# Patient Record
Sex: Male | Born: 1935 | Race: White | Hispanic: No | Marital: Married | Smoking: Never smoker
Health system: Southern US, Community
[De-identification: ages and names within clinical notes are randomized; demographics above are authoritative.]

## PROBLEM LIST (undated history)

## (undated) DIAGNOSIS — E785 Hyperlipidemia, unspecified: Secondary | ICD-10-CM

## (undated) DIAGNOSIS — K219 Gastro-esophageal reflux disease without esophagitis: Secondary | ICD-10-CM

## (undated) DIAGNOSIS — J45909 Unspecified asthma, uncomplicated: Secondary | ICD-10-CM

## (undated) DIAGNOSIS — I10 Essential (primary) hypertension: Secondary | ICD-10-CM

## (undated) DIAGNOSIS — J449 Chronic obstructive pulmonary disease, unspecified: Secondary | ICD-10-CM

## (undated) HISTORY — DX: Unspecified asthma, uncomplicated: J45.909

## (undated) HISTORY — DX: Essential (primary) hypertension: I10

## (undated) HISTORY — DX: Chronic obstructive pulmonary disease, unspecified: J44.9

## (undated) HISTORY — DX: Hyperlipidemia, unspecified: E78.5

## (undated) HISTORY — DX: Gastro-esophageal reflux disease without esophagitis: K21.9

---

## 2001-10-25 ENCOUNTER — Emergency Department (HOSPITAL_COMMUNITY): Admission: EM | Admit: 2001-10-25 | Discharge: 2001-10-26 | Payer: Self-pay | Admitting: Emergency Medicine

## 2012-02-19 ENCOUNTER — Encounter (HOSPITAL_BASED_OUTPATIENT_CLINIC_OR_DEPARTMENT_OTHER): Payer: Self-pay

## 2012-02-19 ENCOUNTER — Ambulatory Visit (HOSPITAL_BASED_OUTPATIENT_CLINIC_OR_DEPARTMENT_OTHER)
Admission: RE | Admit: 2012-02-19 | Discharge: 2012-02-19 | Disposition: A | Discharge status: Home / Self Care | Payer: Medicare Other | Source: Ambulatory Visit | Attending: Family Medicine | Admitting: Family Medicine

## 2012-02-19 ENCOUNTER — Other Ambulatory Visit (HOSPITAL_BASED_OUTPATIENT_CLINIC_OR_DEPARTMENT_OTHER): Payer: Self-pay | Admitting: Family Medicine

## 2012-02-19 DIAGNOSIS — R7989 Other specified abnormal findings of blood chemistry: Secondary | ICD-10-CM

## 2012-02-19 DIAGNOSIS — R799 Abnormal finding of blood chemistry, unspecified: Secondary | ICD-10-CM | POA: Insufficient documentation

## 2012-02-19 DIAGNOSIS — R0602 Shortness of breath: Secondary | ICD-10-CM | POA: Insufficient documentation

## 2012-02-19 MED ORDER — IOHEXOL 350 MG/ML SOLN
80.0000 mL | Freq: Once | INTRAVENOUS | Status: AC | PRN
  Administered 2012-02-19: 80 mL via INTRAVENOUS

## 2013-03-24 ENCOUNTER — Ambulatory Visit: Payer: Medicare Other | Attending: Physical Medicine and Rehabilitation | Admitting: Physical Therapy

## 2013-03-24 DIAGNOSIS — Z9181 History of falling: Secondary | ICD-10-CM | POA: Insufficient documentation

## 2013-03-24 DIAGNOSIS — R262 Difficulty in walking, not elsewhere classified: Secondary | ICD-10-CM | POA: Insufficient documentation

## 2013-03-24 DIAGNOSIS — IMO0001 Reserved for inherently not codable concepts without codable children: Secondary | ICD-10-CM | POA: Insufficient documentation

## 2013-03-24 DIAGNOSIS — M6281 Muscle weakness (generalized): Secondary | ICD-10-CM | POA: Insufficient documentation

## 2013-03-27 ENCOUNTER — Ambulatory Visit: Payer: Medicare Other | Admitting: Physical Therapy

## 2013-03-31 ENCOUNTER — Ambulatory Visit: Payer: Medicare Other | Admitting: Physical Therapy

## 2013-04-03 ENCOUNTER — Ambulatory Visit: Payer: Medicare Other | Admitting: Physical Therapy

## 2013-04-15 ENCOUNTER — Ambulatory Visit: Payer: Medicare Other | Attending: Physical Medicine and Rehabilitation | Admitting: Physical Therapy

## 2013-04-15 DIAGNOSIS — Z9181 History of falling: Secondary | ICD-10-CM | POA: Insufficient documentation

## 2013-04-15 DIAGNOSIS — R262 Difficulty in walking, not elsewhere classified: Secondary | ICD-10-CM | POA: Insufficient documentation

## 2013-04-15 DIAGNOSIS — M6281 Muscle weakness (generalized): Secondary | ICD-10-CM | POA: Insufficient documentation

## 2013-04-15 DIAGNOSIS — IMO0001 Reserved for inherently not codable concepts without codable children: Secondary | ICD-10-CM | POA: Insufficient documentation

## 2013-04-21 ENCOUNTER — Ambulatory Visit: Payer: Medicare Other | Admitting: Physical Therapy

## 2013-04-28 ENCOUNTER — Ambulatory Visit: Payer: Medicare Other | Attending: Physical Medicine and Rehabilitation | Admitting: Physical Therapy

## 2013-04-28 DIAGNOSIS — M6281 Muscle weakness (generalized): Secondary | ICD-10-CM | POA: Insufficient documentation

## 2013-04-28 DIAGNOSIS — R262 Difficulty in walking, not elsewhere classified: Secondary | ICD-10-CM | POA: Insufficient documentation

## 2013-04-28 DIAGNOSIS — IMO0001 Reserved for inherently not codable concepts without codable children: Secondary | ICD-10-CM | POA: Insufficient documentation

## 2013-04-28 DIAGNOSIS — Z9181 History of falling: Secondary | ICD-10-CM | POA: Insufficient documentation

## 2013-04-30 ENCOUNTER — Ambulatory Visit: Payer: Medicare Other | Admitting: Physical Therapy

## 2013-05-04 ENCOUNTER — Ambulatory Visit: Payer: Medicare Other | Admitting: Physical Therapy

## 2013-05-07 ENCOUNTER — Ambulatory Visit: Payer: Medicare Other | Admitting: Physical Therapy

## 2013-05-12 ENCOUNTER — Ambulatory Visit: Payer: Medicare Other | Admitting: Physical Therapy

## 2013-05-14 ENCOUNTER — Ambulatory Visit: Payer: Medicare Other | Admitting: Physical Therapy

## 2013-05-19 ENCOUNTER — Ambulatory Visit: Payer: Medicare Other | Admitting: Physical Therapy

## 2013-05-25 ENCOUNTER — Ambulatory Visit: Payer: Medicare Other | Admitting: Physical Therapy

## 2013-05-27 ENCOUNTER — Ambulatory Visit: Payer: Medicare Other | Admitting: Physical Therapy

## 2013-06-02 ENCOUNTER — Ambulatory Visit: Payer: Medicare Other | Attending: Physical Medicine and Rehabilitation | Admitting: Physical Therapy

## 2013-06-02 DIAGNOSIS — IMO0001 Reserved for inherently not codable concepts without codable children: Secondary | ICD-10-CM | POA: Insufficient documentation

## 2013-06-02 DIAGNOSIS — Z9181 History of falling: Secondary | ICD-10-CM | POA: Insufficient documentation

## 2013-06-02 DIAGNOSIS — M6281 Muscle weakness (generalized): Secondary | ICD-10-CM | POA: Insufficient documentation

## 2013-06-02 DIAGNOSIS — R262 Difficulty in walking, not elsewhere classified: Secondary | ICD-10-CM | POA: Insufficient documentation

## 2013-06-04 ENCOUNTER — Ambulatory Visit: Payer: Medicare Other | Admitting: Physical Therapy

## 2013-06-09 ENCOUNTER — Ambulatory Visit: Payer: Medicare Other | Admitting: Physical Therapy

## 2013-06-11 ENCOUNTER — Ambulatory Visit: Payer: Medicare Other | Admitting: Physical Therapy

## 2013-06-15 ENCOUNTER — Ambulatory Visit: Payer: Medicare Other | Admitting: Physical Therapy

## 2013-06-17 ENCOUNTER — Ambulatory Visit: Payer: Medicare Other | Admitting: Physical Therapy

## 2013-06-23 ENCOUNTER — Ambulatory Visit: Payer: Medicare Other | Admitting: Physical Therapy

## 2013-06-25 ENCOUNTER — Ambulatory Visit: Payer: Medicare Other | Admitting: Physical Therapy

## 2013-11-10 IMAGING — CT CT ANGIO CHEST
2 of 6 series · 19 of 36 positions shown · IV contrast (APPLIED)
Comparison: None.

CLINICAL DATA: Short of breath.  Elevated D-dimer.

CT ANGIOGRAPHY CHEST
TECHNIQUE: Multidetector CT imaging of the chest using the
standard protocol during bolus administration of intravenous
contrast. Multiplanar reconstructed images including MIPs were
obtained and reviewed to evaluate the vascular anatomy.
Contrast:  80 ml Omnipaque 350

[Series 5: pe 1.0 b25f · axial · 0.70mm/px · z∈[-255,-19]mm · 18 of 264 slices shown]
[im 14/264  lung]
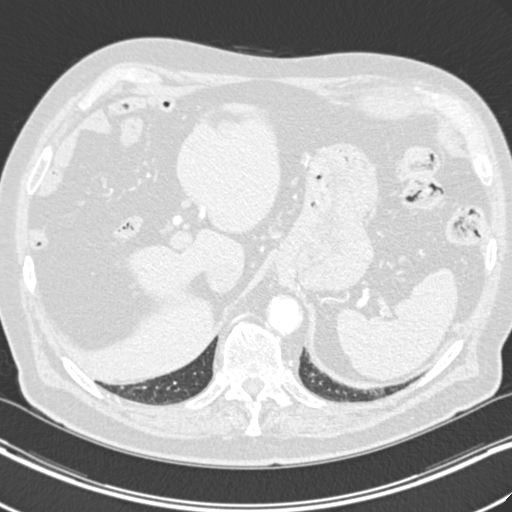
[im 27/264  mediastinal]
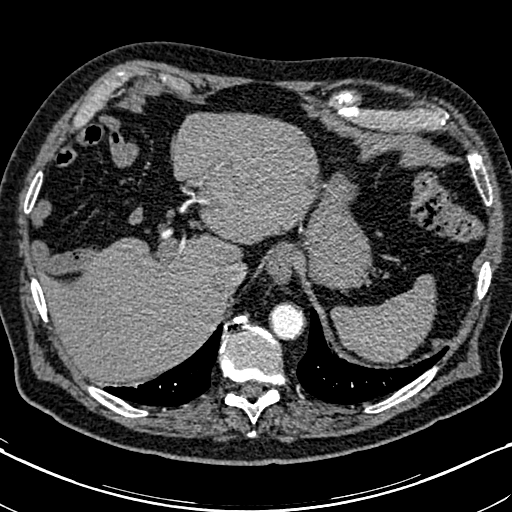
[im 40/264  lung]
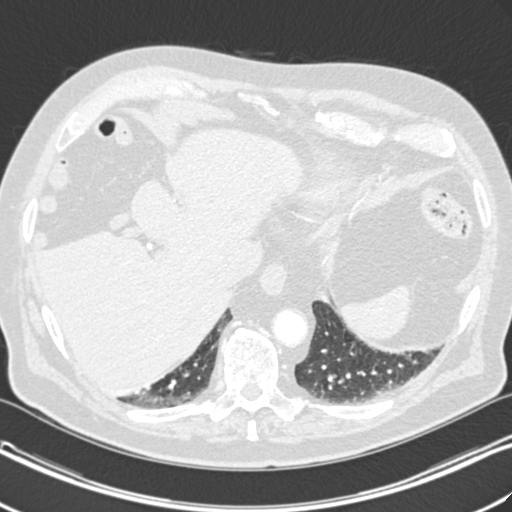
[im 53/264  mediastinal]
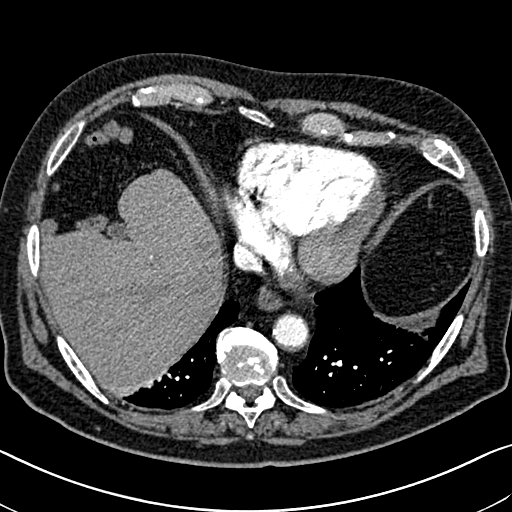
[im 66/264  lung]
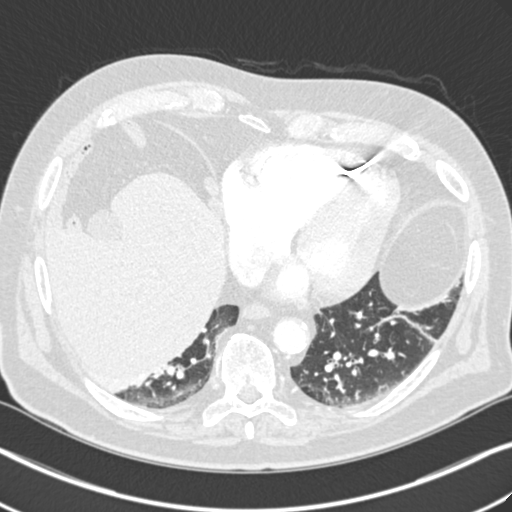
[im 79/264  mediastinal]
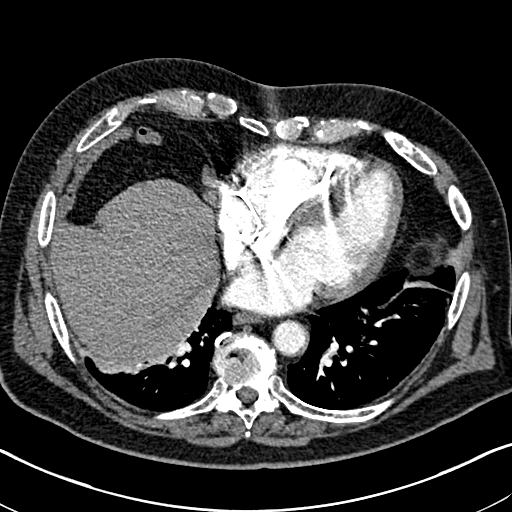
[im 93/264  lung]
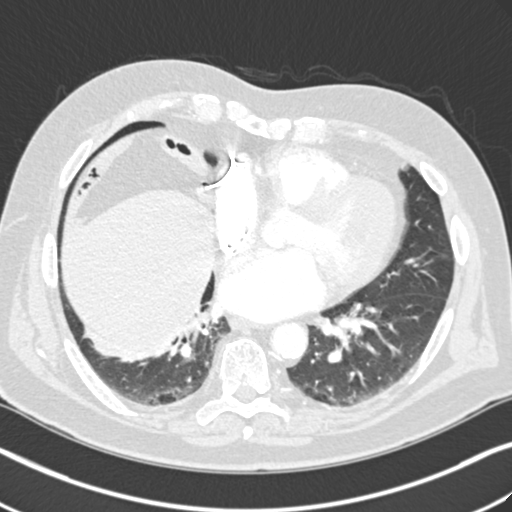
[im 106/264  mediastinal]
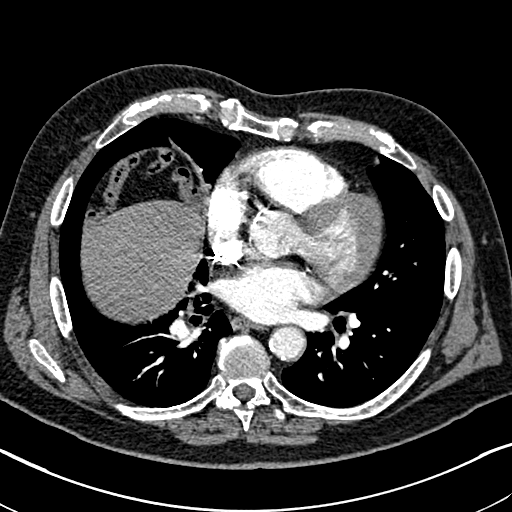
[im 119/264  lung]
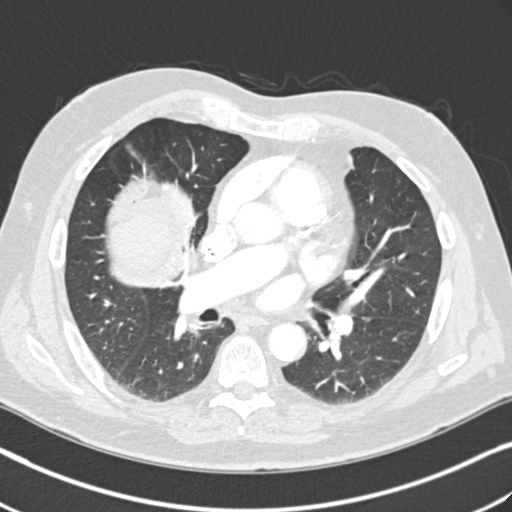
[im 145/264  mediastinal]
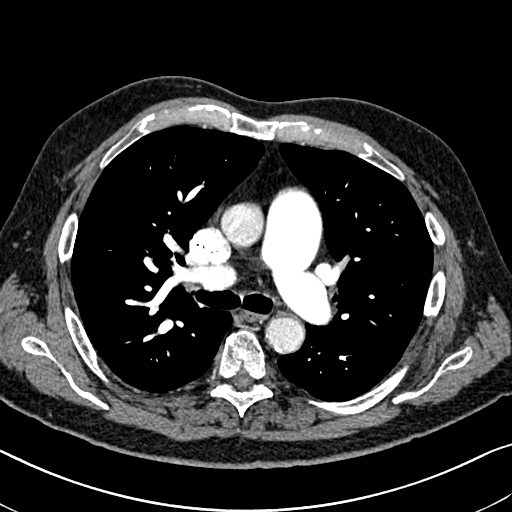
[im 158/264  lung]
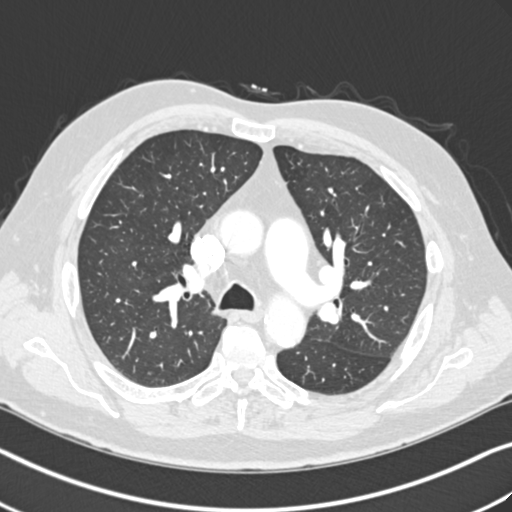
[im 171/264  mediastinal]
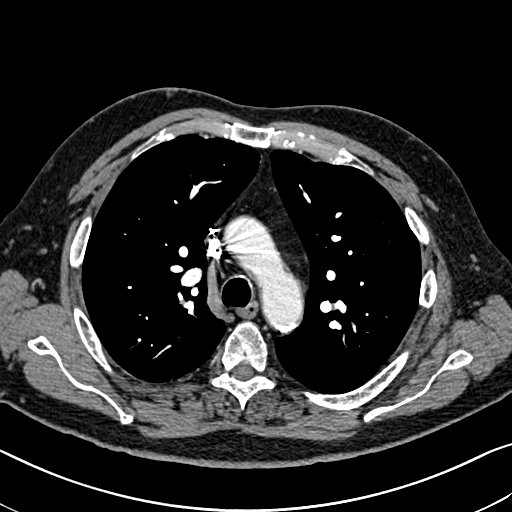
[im 185/264  lung]
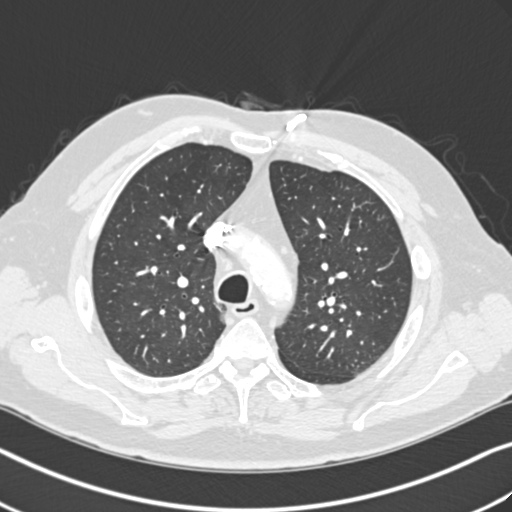
[im 198/264  mediastinal]
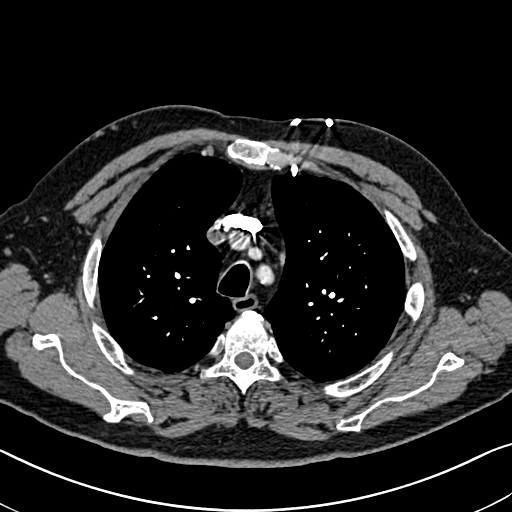
[im 211/264  lung]
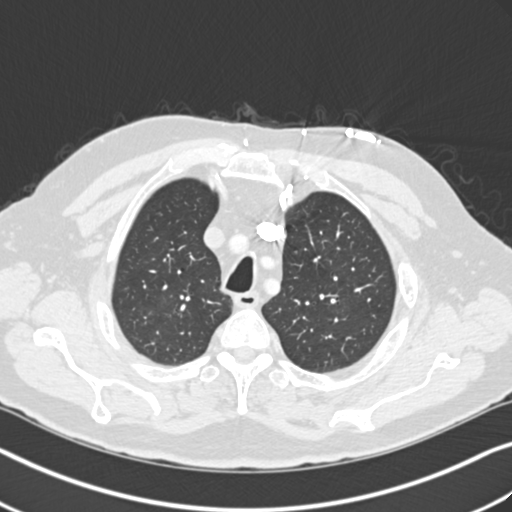
[im 224/264  mediastinal]
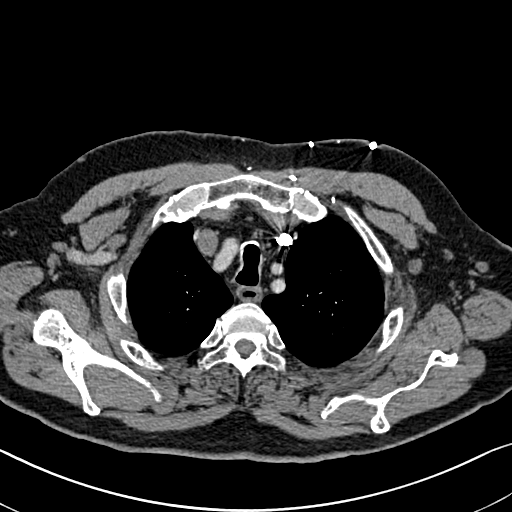
[im 237/264  lung]
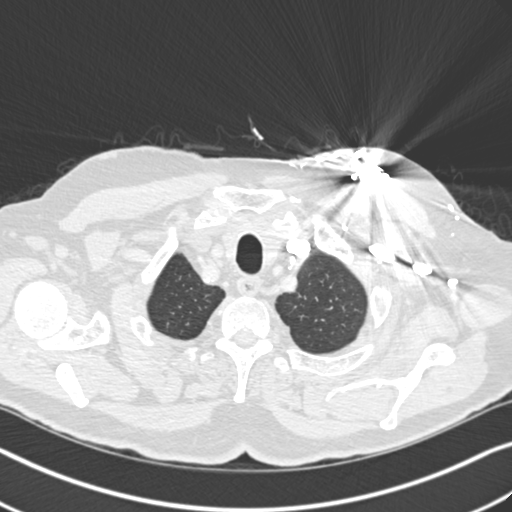
[im 250/264  mediastinal]
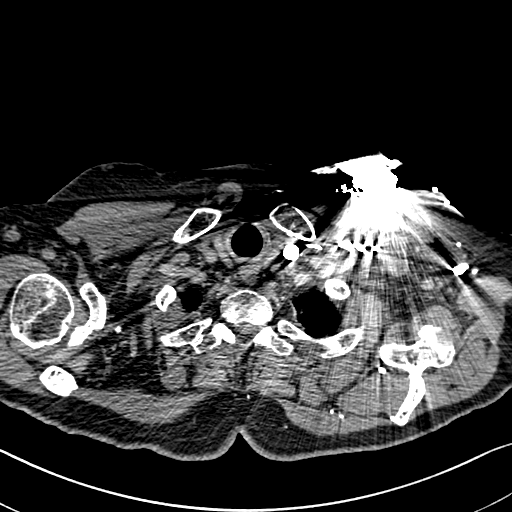

[Series 8: pe 2.0 coronal · coronal · 0.51mm/px · 1 of 141 slices shown]
[im 71/141  mediastinal]
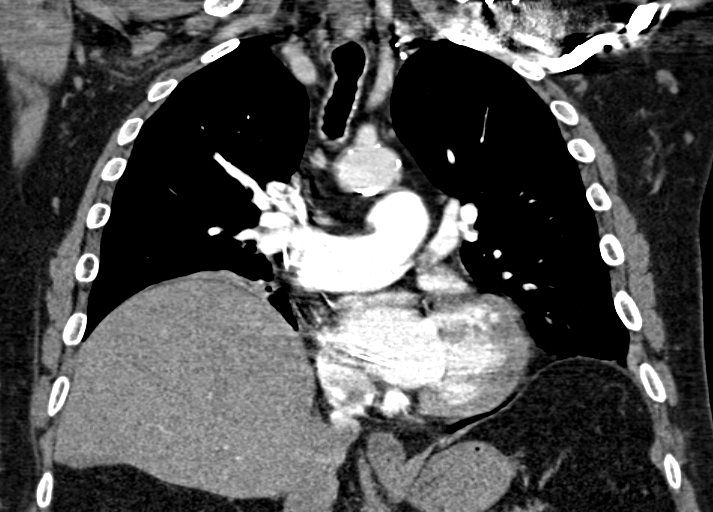

[19 of 36 positions shown; findings below may reference images not displayed]

FINDINGS: There are no filling defects in the pulmonary arterial
tree to suggest acute pulmonary thromboembolism.

Left subclavian dual lead pacemaker device in place.  Tips of the
leads are in the right atrium and right ventricle.  Minimal
calcifications of the aorta, aortic valve, left main coronary
artery, circumflex coronary artery, right coronary artery.

Lungs are markedly under aerated with basilar subsegmental
atelectasis.  No consolidation.

No pneumothorax.  No pleural effusion.

Several sub centimeter hypodensities are scattered within the liver
and primarily in the left lobe.  These are nonspecific and likely
benign.
IMPRESSION: No evidence of acute pulmonary thromboembolism.

## 2014-04-09 ENCOUNTER — Other Ambulatory Visit: Payer: Self-pay | Admitting: *Deleted

## 2014-04-09 MED ORDER — ZOLPIDEM TARTRATE 5 MG PO TABS: 5.0000 mg | ORAL_TABLET | Freq: Every evening | ORAL | Status: AC | PRN

## 2014-04-09 NOTE — Telephone Encounter (Signed)
[facility] 

## 2014-04-14 ENCOUNTER — Non-Acute Institutional Stay (SKILLED_NURSING_FACILITY): Payer: Medicare Other | Admitting: Internal Medicine

## 2014-04-14 DIAGNOSIS — E785 Hyperlipidemia, unspecified: Secondary | ICD-10-CM

## 2014-04-14 DIAGNOSIS — J439 Emphysema, unspecified: Secondary | ICD-10-CM

## 2014-04-14 DIAGNOSIS — R29898 Other symptoms and signs involving the musculoskeletal system: Secondary | ICD-10-CM

## 2014-04-14 DIAGNOSIS — G610 Guillain-Barre syndrome: Secondary | ICD-10-CM

## 2014-04-14 DIAGNOSIS — I10 Essential (primary) hypertension: Secondary | ICD-10-CM

## 2014-04-14 DIAGNOSIS — K219 Gastro-esophageal reflux disease without esophagitis: Secondary | ICD-10-CM

## 2014-04-14 NOTE — Progress Notes (Signed)
MRN: [id number] Name: Troy HockingRobert [name]  Sex: male Age: 80 y.o. DOB: 05/08/1923  PSC #: Troy [name] Facility/Room: 509 Level Of Care: SNF Provider: Merrilee [name], Troy [name] Emergency Contacts: Extended Emergency Contact Information Primary Emergency Contact: Troy [name],Troy [name] Address: [address]          [place], [place] [address] [place] Home Phone: [phone] Mobile Phone: [phone] Relation: Spouse  Code Status:DNR   Allergies: Review of patient's allergies indicates not on file.  Chief Complaint  Patient presents with  . New Admit To SNF    HPI: Patient is 80 y.o. male who is admitted to SNF with extremity weakness progressing LE> UE, with no dx,  with a hx of GBS a year ago that spontaneously resolved, admitted for OT/PT after minimal gains at the hospital with OT/PT.  Past Medical History  Diagnosis Date  . Hypertension   . Hyperlipidemia   . COPD (chronic obstructive pulmonary disease)   . Asthma   . GERD (gastroesophageal reflux disease)     History reviewed. No pertinent past surgical history.    Medication List       This list is accurate as of: 04/14/14 11:59 PM.  Always use your most recent med list.               acetaminophen-codeine 300-30 MG per tablet  Commonly known as:  TYLENOL #3  Take by mouth every 8 (eight) hours as needed for moderate pain.     albuterol (2.5 MG/3ML) 0.083% nebulizer solution  Commonly known as:  PROVENTIL  Take 2.5 mg by nebulization every 6 (six) hours as needed for wheezing or shortness of breath.     aspirin 81 MG tablet  Take 81 mg by mouth daily.     Calcium Carbonate-Vitamin [name] 600-200 MG-UNIT Caps  Take 1 tablet by mouth daily.     doxazosin 1 MG tablet  Commonly known as:  CARDURA  Take 1 mg by mouth daily.     Fluticasone-Salmeterol 500-50 MCG/DOSE Aepb  Commonly known as:  ADVAIR  Inhale 1 puff into the lungs 2 (two) times daily.     loratadine 10 MG tablet  Commonly known as:  CLARITIN  Take  10 mg by mouth daily as needed for allergies.     Magnesium Oxide 250 MG Tabs  Take 1 tablet by mouth daily.     metoprolol succinate 50 MG 24 hr tablet  Commonly known as:  TOPROL-XL  Take 50 mg by mouth daily. Take with or immediately following a meal.     multivitamin capsule  Take 1 capsule by mouth daily.     omega-3 acid ethyl esters 1 G capsule  Commonly known as:  LOVAZA  Take 2 g by mouth daily.     polyethylene glycol packet  Commonly known as:  MIRALAX / GLYCOLAX  Take 17 g by mouth daily as needed.     potassium chloride 10 MEQ tablet  Commonly known as:  K-DUR  Take 20 mEq by mouth daily.     predniSONE 10 MG tablet  Commonly known as:  DELTASONE  Take 10 mg by mouth daily with breakfast.     ranitidine 150 MG capsule  Commonly known as:  ZANTAC  Take 150 mg by mouth 2 (two) times daily.     SYSTANE 0.4-0.3 % Soln  Generic drug:  Polyethyl Glycol-Propyl Glycol  Apply 1 drop to eye daily.     telmisartan 80 MG tablet  Commonly known as:  MICARDIS  Take 80 mg by mouth daily.     triamcinolone cream 0.1 %  Commonly known as:  KENALOG  Apply 1 application topically 2 (two) times daily as needed.     vitamin C 500 MG tablet  Commonly known as:  ASCORBIC ACID  Take 500 mg by mouth daily.     zolpidem 5 MG tablet  Commonly known as:  AMBIEN  Take 1 tablet (5 mg total) by mouth at bedtime as needed for sleep.        Meds ordered this encounter  Medications  . acetaminophen-codeine (TYLENOL #3) 300-30 MG per tablet    Sig: Take by mouth every 8 (eight) hours as needed for moderate pain.  Marland Kitchen. albuterol (PROVENTIL) (2.5 MG/3ML) 0.083% nebulizer solution    Sig: Take 2.5 mg by nebulization every 6 (six) hours as needed for wheezing or shortness of breath.  . vitamin C (ASCORBIC ACID) 500 MG tablet    Sig: Take 500 mg by mouth daily.  Marland Kitchen. aspirin 81 MG tablet    Sig: Take 81 mg by mouth daily.  . Calcium Carbonate-Vitamin [name] 600-200 MG-UNIT CAPS    Sig:  Take 1 tablet by mouth daily.  Marland Kitchen. doxazosin (CARDURA) 1 MG tablet    Sig: Take 1 mg by mouth daily.  . Fluticasone-Salmeterol (ADVAIR) 500-50 MCG/DOSE AEPB    Sig: Inhale 1 puff into the lungs 2 (two) times daily.  Marland Kitchen. loratadine (CLARITIN) 10 MG tablet    Sig: Take 10 mg by mouth daily as needed for allergies.  . Magnesium Oxide 250 MG TABS    Sig: Take 1 tablet by mouth daily.  . metoprolol succinate (TOPROL-XL) 50 MG 24 hr tablet    Sig: Take 50 mg by mouth daily. Take with or immediately following a meal.  . Multiple Vitamin (MULTIVITAMIN) capsule    Sig: Take 1 capsule by mouth daily.  Marland Kitchen. omega-3 acid ethyl esters (LOVAZA) 1 G capsule    Sig: Take 2 g by mouth daily.  Bertram Gala. Polyethyl Glycol-Propyl Glycol (SYSTANE) 0.4-0.3 % SOLN    Sig: Apply 1 drop to eye daily.  . polyethylene glycol (MIRALAX / GLYCOLAX) packet    Sig: Take 17 g by mouth daily as needed.  . potassium chloride (K-DUR) 10 MEQ tablet    Sig: Take 20 mEq by mouth daily.  . ranitidine (ZANTAC) 150 MG capsule    Sig: Take 150 mg by mouth 2 (two) times daily.  Marland Kitchen. telmisartan (MICARDIS) 80 MG tablet    Sig: Take 80 mg by mouth daily.  Marland Kitchen. triamcinolone cream (KENALOG) 0.1 %    Sig: Apply 1 application topically 2 (two) times daily as needed.  . predniSONE (DELTASONE) 10 MG tablet    Sig: Take 10 mg by mouth daily with breakfast.     There is no immunization history on file for this patient.  History  Substance Use Topics  . Smoking status: Never Smoker   . Smokeless tobacco: Not on file  . Alcohol Use: 0.6 oz/week    1 Glasses of wine per week    Family history is noncontributory    Review of Systems  DATA OBTAINED: from patient GENERAL:  no fevers, fatigue, appetite changes SKIN: No itching, rash or wounds EYES: No eye pain, redness, discharge EARS: No earache, tinnitus, change in hearing NOSE: No congestion, drainage or bleeding  MOUTH/THROAT: No mouth or tooth pain, No sore throat RESPIRATORY: No cough,  wheezing, SOB CARDIAC: No chest pain, palpitations,  lower extremity edema  GI: No abdominal pain, No N/V/[name] or constipation, No heartburn or reflux  GU: No dysuria, frequency or urgency, or incontinence  MUSCULOSKELETAL: No unrelieved bone/joint pain NEUROLOGIC: No headache, dizziness or focal weakness PSYCHIATRIC: No overt anxiety or sadness, No behavior issue.   Filed Vitals:   04/15/14 1932  BP: 135/75  Pulse: 70  Temp: 97.2 F (36.2 C)  Resp: 18    Physical Exam  GENERAL APPEARANCE: Alert,mod conversant,  No acute distress.  SKIN: No diaphoresis rash HEAD: Normocephalic, atraumatic  EYES: Conjunctiva/lids clear. Pupils round, reactive. EOMs intact.  EARS: External exam WNL, canals clear. Hearing grossly normal.  NOSE: No deformity or discharge.  MOUTH/THROAT: Lips w/o lesions  RESPIRATORY: Breathing is even, unlabored. Lung sounds are clear   CARDIOVASCULAR: Heart RRR no murmurs, rubs or gallops. No peripheral edema.   GASTROINTESTINAL: Abdomen is soft, non-tender, not distended w/ normal bowel sounds. GENITOURINARY: Bladder non tender, not distended  MUSCULOSKELETAL: No abnormal joints or musculature NEUROLOGIC:  Cranial nerves 2-12 grossly intact. Moves all extremities  PSYCHIATRIC: mild dementia, no behavioral issues  Patient Active Problem List   Diagnosis Date Noted  . GERD (gastroesophageal reflux disease)   . Guillain Barr syndrome 04/15/2014  . Lower extremity weakness 04/15/2014  . Upper extremity weakness 04/15/2014  . Hypertension   . Hyperlipidemia   . COPD (chronic obstructive pulmonary disease)        Assessment and Plan  Guillain Barr syndrome Dx a year ago October 2014, attempted tx with IVIG which caused ARF. Weakness resolved and 5-6 months later pt was able to walk  Lower extremity weakness Onset about 4 weeks ago and progressing until pt couldn't stand up. Pt had been started on prednisone as outpt and was put on prednisone in the hospital  with very little change. Pt can't use IVIG 2/2 renal failure on it prior. Pt is admitted for OT/PT.  Upper extremity weakness Onset about 4 weeks ago and progressing until pt couldn't stand up. Upper extremity weakness< LE weakness Pt had been started on prednisone as outpt and was put on prednisone in the hospital with very little change. Pt can't use IVIG 2/2 renal failure on it prior. Pt is admitted for OT/PT.  COPD (chronic obstructive pulmonary disease) Stable and chronic;advair scheduled and alb pr  Hypertension Cont cardura, valsartan and metoprolol  Hyperlipidemia Continue lovasa  GERD (gastroesophageal reflux disease) Continue zantac BID    Margit Hanks, MD

## 2014-04-15 ENCOUNTER — Encounter: Payer: Self-pay | Admitting: Internal Medicine

## 2014-04-15 DIAGNOSIS — I10 Essential (primary) hypertension: Secondary | ICD-10-CM | POA: Insufficient documentation

## 2014-04-15 DIAGNOSIS — R29898 Other symptoms and signs involving the musculoskeletal system: Secondary | ICD-10-CM | POA: Insufficient documentation

## 2014-04-15 DIAGNOSIS — E785 Hyperlipidemia, unspecified: Secondary | ICD-10-CM | POA: Insufficient documentation

## 2014-04-15 DIAGNOSIS — G610 Guillain-Barre syndrome: Secondary | ICD-10-CM | POA: Insufficient documentation

## 2014-04-15 DIAGNOSIS — J449 Chronic obstructive pulmonary disease, unspecified: Secondary | ICD-10-CM | POA: Insufficient documentation

## 2014-04-15 NOTE — Assessment & Plan Note (Signed)
Dx a year ago October 2014, attempted tx with IVIG which caused ARF. Weakness resolved and 5-6 months later pt was able to walk

## 2014-04-15 NOTE — Assessment & Plan Note (Deleted)
Onset about 4 weeks ago, worsening, worse in lower extremity than upper extremity; seen as outpt and started on prednisone 30 mg which hasn't helped much and pt has been decreased to 20 mg daily. Seen by neurology in hospital and they initiated a workup. Of note pt is not a candidate for IVIG and it was stated that pt cannot remain on prednisone very long 2/2 elevated CPK.OT/PT was consulted in hospital but minimal gains were made. Even so, it was recommended that pt receive OT/PT at SNF.

## 2014-04-17 ENCOUNTER — Encounter: Payer: Self-pay | Admitting: Internal Medicine

## 2014-04-17 DIAGNOSIS — K219 Gastro-esophageal reflux disease without esophagitis: Secondary | ICD-10-CM | POA: Insufficient documentation

## 2014-04-17 NOTE — Assessment & Plan Note (Addendum)
Cont cardura, valsartan and metoprolol

## 2014-04-17 NOTE — Assessment & Plan Note (Signed)
Onset about 4 weeks ago and progressing until pt couldn't stand up. Upper extremity weakness< LE weakness Pt had been started on prednisone as outpt and was put on prednisone in the hospital with very little change. Pt can't use IVIG 2/2 renal failure on it prior. Pt is admitted for OT/PT.

## 2014-04-17 NOTE — Assessment & Plan Note (Signed)
Stable and chronic;advair scheduled and alb pr

## 2014-04-17 NOTE — Assessment & Plan Note (Signed)
Continue lovasa

## 2014-04-17 NOTE — Assessment & Plan Note (Signed)
Continue zantac BID 

## 2014-04-17 NOTE — Assessment & Plan Note (Signed)
Onset about 4 weeks ago and progressing until pt couldn't stand up. Pt had been started on prednisone as outpt and was put on prednisone in the hospital with very little change. Pt can't use IVIG 2/2 renal failure on it prior. Pt is admitted for OT/PT.

## 2017-04-13 MED ORDER — ALBUTEROL SULFATE HFA 108 (90 BASE) MCG/ACT IN AERS
2.00 | INHALATION_SPRAY | RESPIRATORY_TRACT | Status: DC
Start: ? — End: 2017-04-13

## 2017-04-13 MED ORDER — PANTOPRAZOLE SODIUM 40 MG IV SOLR
40.00 mg | INTRAVENOUS | Status: DC
Start: 2017-04-14 — End: 2017-04-13

## 2017-04-13 MED ORDER — ONDANSETRON HCL 4 MG/2ML IJ SOLN
4.00 mg | INTRAMUSCULAR | Status: DC
Start: ? — End: 2017-04-13

## 2017-04-13 MED ORDER — METOPROLOL SUCCINATE ER 50 MG PO TB24
50.00 mg | ORAL_TABLET | ORAL | Status: DC
Start: 2017-04-14 — End: 2017-04-13

## 2017-04-13 MED ORDER — COLCHICINE 0.6 MG PO TABS
0.60 mg | ORAL_TABLET | ORAL | Status: DC
Start: 2017-04-14 — End: 2017-04-13

## 2017-04-13 MED ORDER — GABAPENTIN 300 MG PO CAPS
300.00 mg | ORAL_CAPSULE | ORAL | Status: DC
Start: 2017-04-13 — End: 2017-04-13

## 2017-04-13 MED ORDER — SODIUM CHLORIDE 0.9 % IV SOLN
INTRAVENOUS | Status: DC
Start: ? — End: 2017-04-13

## 2017-04-13 MED ORDER — FLUTICASONE FUROATE-VILANTEROL 100-25 MCG/INH IN AEPB
1.00 | INHALATION_SPRAY | RESPIRATORY_TRACT | Status: DC
Start: 2017-04-14 — End: 2017-04-13

## 2017-04-13 MED ORDER — ROPINIROLE HCL 1 MG PO TABS
1.00 mg | ORAL_TABLET | ORAL | Status: DC
Start: 2017-04-13 — End: 2017-04-13

## 2017-04-13 MED ORDER — GENERIC EXTERNAL MEDICATION
1.00 | Status: DC
Start: ? — End: 2017-04-13

## 2017-04-13 MED ORDER — MONTELUKAST SODIUM 10 MG PO TABS
10.00 mg | ORAL_TABLET | ORAL | Status: DC
Start: 2017-04-13 — End: 2017-04-13

## 2017-04-13 MED ORDER — GENERIC EXTERNAL MEDICATION
Status: DC
Start: ? — End: 2017-04-13

## 2017-04-13 MED ORDER — ALLOPURINOL 100 MG PO TABS
100.00 mg | ORAL_TABLET | ORAL | Status: DC
Start: 2017-04-14 — End: 2017-04-13

## 2017-04-13 MED ORDER — ACETAMINOPHEN 325 MG PO TABS
650.00 mg | ORAL_TABLET | ORAL | Status: DC
Start: ? — End: 2017-04-13

## 2017-06-28 DEATH — deceased
